# Patient Record
Sex: Male | Born: 1970 | Race: White | Hispanic: Yes | Marital: Single | Smoking: Never smoker
Health system: Southern US, Community
[De-identification: ages and names within clinical notes are randomized; demographics above are authoritative.]

## PROBLEM LIST (undated history)

## (undated) DIAGNOSIS — G4730 Sleep apnea, unspecified: Secondary | ICD-10-CM

## (undated) DIAGNOSIS — T7840XA Allergy, unspecified, initial encounter: Secondary | ICD-10-CM

## (undated) DIAGNOSIS — I10 Essential (primary) hypertension: Secondary | ICD-10-CM

## (undated) DIAGNOSIS — B1910 Unspecified viral hepatitis B without hepatic coma: Secondary | ICD-10-CM

## (undated) DIAGNOSIS — F419 Anxiety disorder, unspecified: Secondary | ICD-10-CM

## (undated) DIAGNOSIS — K760 Fatty (change of) liver, not elsewhere classified: Secondary | ICD-10-CM

## (undated) HISTORY — DX: Fatty (change of) liver, not elsewhere classified: K76.0

## (undated) HISTORY — DX: Essential (primary) hypertension: I10

## (undated) HISTORY — DX: Anxiety disorder, unspecified: F41.9

## (undated) HISTORY — DX: Unspecified viral hepatitis B without hepatic coma: B19.10

## (undated) HISTORY — PX: OTHER SURGICAL HISTORY: SHX169

## (undated) HISTORY — PX: COLONOSCOPY: SHX174

## (undated) HISTORY — DX: Allergy, unspecified, initial encounter: T78.40XA

## (undated) HISTORY — DX: Sleep apnea, unspecified: G47.30

---

## 2020-01-28 ENCOUNTER — Encounter: Payer: Self-pay | Admitting: Gastroenterology

## 2020-01-28 ENCOUNTER — Ambulatory Visit (INDEPENDENT_AMBULATORY_CARE_PROVIDER_SITE_OTHER): Payer: 59 | Admitting: Gastroenterology

## 2020-01-28 ENCOUNTER — Other Ambulatory Visit (INDEPENDENT_AMBULATORY_CARE_PROVIDER_SITE_OTHER): Payer: 59

## 2020-01-28 VITALS — BP 140/100 | HR 80 | Ht 70.0 in | Wt 203.4 lb

## 2020-01-28 DIAGNOSIS — Z800 Family history of malignant neoplasm of digestive organs: Secondary | ICD-10-CM

## 2020-01-28 DIAGNOSIS — B1910 Unspecified viral hepatitis B without hepatic coma: Secondary | ICD-10-CM | POA: Diagnosis not present

## 2020-01-28 DIAGNOSIS — R1013 Epigastric pain: Secondary | ICD-10-CM | POA: Diagnosis not present

## 2020-01-28 DIAGNOSIS — K760 Fatty (change of) liver, not elsewhere classified: Secondary | ICD-10-CM

## 2020-01-28 LAB — CBC WITH DIFFERENTIAL/PLATELET
Basophils Absolute: 0.1 10*3/uL (ref 0.0–0.1)
Basophils Relative: 1 % (ref 0.0–3.0)
Eosinophils Absolute: 0.2 10*3/uL (ref 0.0–0.7)
Eosinophils Relative: 2.7 % (ref 0.0–5.0)
HCT: 45.9 % (ref 39.0–52.0)
Hemoglobin: 15.9 g/dL (ref 13.0–17.0)
Lymphocytes Relative: 28.2 % (ref 12.0–46.0)
Lymphs Abs: 2 10*3/uL (ref 0.7–4.0)
MCHC: 34.7 g/dL (ref 30.0–36.0)
MCV: 90.2 fl (ref 78.0–100.0)
Monocytes Absolute: 0.7 10*3/uL (ref 0.1–1.0)
Monocytes Relative: 9.3 % (ref 3.0–12.0)
Neutro Abs: 4.2 10*3/uL (ref 1.4–7.7)
Neutrophils Relative %: 58.8 % (ref 43.0–77.0)
Platelets: 267 10*3/uL (ref 150.0–400.0)
RBC: 5.08 Mil/uL (ref 4.22–5.81)
RDW: 12.6 % (ref 11.5–15.5)
WBC: 7.1 10*3/uL (ref 4.0–10.5)

## 2020-01-28 MED ORDER — SUPREP BOWEL PREP KIT 17.5-3.13-1.6 GM/177ML PO SOLN
1.0000 | ORAL | 0 refills | Status: DC
Start: 2020-01-28 — End: 2020-03-26

## 2020-01-28 NOTE — Patient Instructions (Signed)
You have been scheduled for an abdominal ultrasound at [facility] - enter through ER and let them know your there for an outpatient ultrasound) on 01/31/20 at 7:00am. Please arrive 15 minutes prior to your appointment for registration. Make certain not to have anything to eat or drink 6 hours prior to your appointment. Should you need to reschedule your appointment, please contact radiology at [phone]. This test typically takes about 30 minutes to perform.  Your provider has requested that you go to the basement level for lab work before leaving today. Press "B" on the elevator. The lab is located at the first door on the left as you exit the elevator.  You have been scheduled for an endoscopy and colonoscopy. Please follow the written instructions given to you at your visit today. Please pick up your prep supplies at the pharmacy within the next 1-3 days. If you use inhalers (even only as needed), please bring them with you on the day of your procedure.  A referral has been made to ID re: Hepatitis B - Their office will contact you with an appointment.   Start Omeprazole 40mg - once daily every morning.   We have sent the following medications to your pharmacy for you to pick up at your convenience: Suprep   Due to recent changes in healthcare laws, you may see the results of your imaging and laboratory studies on MyChart before your provider has had a chance to review them.  We understand that in some cases there may be results that are confusing or concerning to you. Not all laboratory results come back in the same time frame and the provider may be waiting for multiple results in order to interpret others.  Please give [place] 48 hours in order for your provider to thoroughly review all the results before contacting the office for clarification of your results.   Thank you for choosing me and [facility].  Dr. Thornton Park

## 2020-01-28 NOTE — Progress Notes (Signed)
Referring Provider: No ref. provider found Primary Care Physician:  Ezequiel Kayser, MD  Reason for Consultation:  Chronic hepatitis B   IMPRESSION:  Family history of colon cancer (father at age 48) Chronic hepatitis B Epigastric pain/LUQ pain with bloating Fatty liver per patient report  Family history of colon cancer without prior colon cancer screening: Colonoscopy recommended.   Chronic hepatitis B without history of advanced liver disease: Labs and ultrasound today to restage his liver disease. I've recommended that he establish care with ID.  Epigastric pain/LUQ with associated bloating: Must consider both GI and hepatic causes of symptoms. Will plan labs, ultrasound, and endoscopy for further evaluation. Empiric trial of PPI in the meantime.     PLAN: CMP, CBC, PT/INR HBV DNA VL, HBsAg, HBcAb total, HBeAg, HBeAb Abdominal ultrasound Referral to ID re: hepatitis B EGD and colonoscopy Start omeperazole 40 mg QAM Obtain records from [facility] Abstinence from alcohol recommended Review vaccine status at time of next visit  Please see the "Patient Instructions" section for addition details about the plan.  HPI: Francis Bishop is a 49 y.o. male self-referred.  Has an appointment to establish care with a PCP later this summer.  Has OSA but is refusing CPAP, anxiety, obesity, chronic hepatitis B and hypertension.  Had Covid in March 2021 and lost 20 pounds. Some prolonged fatigue.  From [place]. Lived in [place] as a child. 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 Currently working to restore card.  Had his first Covid vaccine last week.   Diagnosed with hepatitis B at time of blood donation in 1998 with a low viral load. He has not had anyone treating his hepatitis B over the last 10 years. Followed by a GI MD in [place], more recently at [facility] in 2009/2010. At that time he was told that he doesn't have the antibody. He has not previously required  treatment. No history of advanced liver disease.   Lately he has been feeling "odd." Wondering if it's related to fatty liver or hepatitis B.  Discomfort on the left side of his abdomen. Epigsatric discomfort started 2 months ago. Associated increasing abdominal girth and bloating.Notes acne on the abdomen, which is new to him. Change in stool caliber. Wonders if this is due to drinking water with the weather.  Weight fluctuates.   Notes increase in stress.   Prescribed omeprazole for LPR  09/2019 he had labs in [place] that showed platelets 247, AST 59, ALT 109, GGT 136, INR 0.98, hct 41.3, hgb 13.8. I am unable to interpret the remainder of the lab results. No recent abdominal imaging.   Father with colon cancer at age 39. No other known family history of colon cancer or polyps. No family history of uterine/endometrial cancer, pancreatic cancer or gastric/stomach cancer.   Past Medical History:  Diagnosis Date  . Anxiety   . Fatty liver   . Hepatitis B   . HTN (hypertension)   . Sleep apnea    pt refuses CPAP    Past Surgical History:  Procedure Laterality Date  . Finger Sugery Left    Thumb    Current Outpatient Medications  Medication Sig Dispense Refill  . Acetylcysteine 600 MG CAPS Take 1 capsule by mouth daily.    . Arginine 500 MG CAPS Take 2 capsules by mouth daily. With Ornithine 250 mg    . B Complex Vitamins (VITAMIN B COMPLEX PO) Take 1 capsule by mouth daily.    Marland Kitchen  cetirizine (ZYRTEC) 10 MG tablet Take 10 mg by mouth as needed for allergies.    . Cholecalciferol (VITAMIN D3) 1.25 MG (50000 UT) TABS Take 1 tablet by mouth daily.    Marland Kitchen GRAPE SEED EXTRACT PO Take 400 mg by mouth daily.    . Milk Thistle 150 MG CAPS Take 1 capsule by mouth daily.    . Misc Natural Products (GLUCOS-CHONDROIT-MSM COMPLEX PO) Take 2 capsules by mouth daily.    . Multiple Vitamin (ONE-A-DAY MENS PO) Take 1 tablet by mouth daily.    . Omega 3-6-9 Fatty Acids (OMEGA 3-6-9 COMPLEX PO) Take 2  capsules by mouth daily.    . Taurine 1000 MG CAPS Take 2 capsules by mouth daily.     No current facility-administered medications for this visit.    Allergies as of 01/28/2020  . (No Known Allergies)    Family History  Problem Relation Age of Onset  . Leukemia Mother   . Hypothyroidism Mother   . Colon cancer Father   . Colon polyps Father   . Diabetes Father   . Hypothyroidism Sister   . Diabetes Paternal Grandmother     Social History   Socioeconomic History  . Marital status: Single    Spouse name: Not on file  . Number of children: 0  . Years of education: Not on file  . Highest education level: Not on file  Occupational History  . Occupation: Interpreter  Tobacco Use  . Smoking status: Never Smoker  . Smokeless tobacco: Never Used  Vaping Use  . Vaping Use: Never used  Substance and Sexual Activity  . Alcohol use: Yes    Comment: 1-2 per week  . Drug use: Never  . Sexual activity: Not on file  Other Topics Concern  . Not on file  Social History Narrative  . Not on file   Social Determinants of Health   Financial Resource Strain:   . Difficulty of Paying Living Expenses:   Food Insecurity:   . Worried About Charity fundraiser in the Last Year:   . Arboriculturist in the Last Year:   Transportation Needs:   . Film/video editor (Medical):   Marland Kitchen Lack of Transportation (Non-Medical):   Physical Activity:   . Days of Exercise per Week:   . Minutes of Exercise per Session:   Stress:   . Feeling of Stress :   Social Connections:   . Frequency of Communication with Friends and Family:   . Frequency of Social Gatherings with Friends and Family:   . Attends Religious Services:   . Active Member of Clubs or Organizations:   . Attends Archivist Meetings:   Marland Kitchen Marital Status:   Intimate Partner Violence:   . Fear of Current or Ex-Partner:   . Emotionally Abused:   Marland Kitchen Physically Abused:   . Sexually Abused:     Review of Systems: 12  system ROS is negative except as noted above with the additions of allergies, anxiety, back pain, fatigue, headaches, muscle cramps, shortness of breath, insomnia, sore throat, excessive thirst, and excessive urination. Marland Kitchen   Physical Exam: General:   Alert,  well-nourished, pleasant and cooperative in NAD. No bilateral temporal wasting.  Head:  Normocephalic and atraumatic. Eyes:  Sclera clear, no icterus.   Conjunctiva pink. Ears:  Normal auditory acuity. Nose:  No deformity, discharge,  or lesions. Mouth:  No deformity or lesions.   Neck:  Supple; no masses or thyromegaly. Lungs:  Clear throughout to auscultation.   No wheezes. Heart:  Regular rate and rhythm; no murmurs. Abdomen:  Soft, nontender, nondistended, normal bowel sounds, no rebound or guarding. No hepatosplenomegaly. No fluid wave.   Rectal:  Deferred  Msk:  Symmetrical. No boney deformities LAD: No inguinal or umbilical LAD Extremities:  No clubbing or edema. Neurologic:  Alert and  oriented x4;  grossly nonfocal Skin:  Intact without significant lesions or rashes. No palmar erythema or spider angioma.  Psych:  Alert and cooperative. Normal mood and affect.     Terry Bolotin L. Tarri Glenn, MD, MPH 01/28/2020, 3:58 PM

## 2020-01-29 ENCOUNTER — Other Ambulatory Visit: Payer: Self-pay

## 2020-01-29 DIAGNOSIS — Z800 Family history of malignant neoplasm of digestive organs: Secondary | ICD-10-CM

## 2020-01-29 DIAGNOSIS — K760 Fatty (change of) liver, not elsewhere classified: Secondary | ICD-10-CM

## 2020-01-29 DIAGNOSIS — B1910 Unspecified viral hepatitis B without hepatic coma: Secondary | ICD-10-CM

## 2020-01-29 DIAGNOSIS — R1013 Epigastric pain: Secondary | ICD-10-CM

## 2020-01-29 LAB — COMPREHENSIVE METABOLIC PANEL
ALT: 69 U/L — ABNORMAL HIGH (ref 0–53)
AST: 32 U/L (ref 0–37)
Albumin: 5.1 g/dL (ref 3.5–5.2)
Alkaline Phosphatase: 37 U/L — ABNORMAL LOW (ref 39–117)
BUN: 20 mg/dL (ref 6–23)
CO2: 25 mEq/L (ref 19–32)
Calcium: 9.9 mg/dL (ref 8.4–10.5)
Chloride: 101 mEq/L (ref 96–112)
Creatinine, Ser: 1.01 mg/dL (ref 0.40–1.50)
GFR: 78.73 mL/min (ref >60.00)
Glucose, Bld: 92 mg/dL (ref 70–99)
Potassium: 3.9 mEq/L (ref 3.5–5.1)
Sodium: 137 mEq/L (ref 135–145)
Total Bilirubin: 0.8 mg/dL (ref 0.2–1.2)
Total Protein: 8.1 g/dL (ref 6.0–8.3)

## 2020-01-29 LAB — HEPATITIS B SURFACE ANTIGEN: Hepatitis B Surface Ag: REACTIVE — AB

## 2020-01-29 LAB — PROTIME-INR
INR: 1 ratio (ref 0.8–1.0)
Prothrombin Time: 11.3 s (ref 9.6–13.1)

## 2020-01-29 LAB — HEPATITIS B E ANTIBODY: Hep B E Ab: REACTIVE — AB

## 2020-01-29 LAB — HEPATITIS B CORE ANTIBODY, TOTAL: Hep B Core Total Ab: REACTIVE — AB

## 2020-01-29 LAB — HEPATITIS B E ANTIGEN: Hep B E Ag: NONREACTIVE

## 2020-01-31 ENCOUNTER — Ambulatory Visit (HOSPITAL_COMMUNITY)
Admission: RE | Admit: 2020-01-31 | Discharge: 2020-01-31 | Disposition: A | Discharge status: Home / Self Care | Payer: 59 | Source: Ambulatory Visit | Attending: Gastroenterology | Admitting: Gastroenterology

## 2020-01-31 ENCOUNTER — Other Ambulatory Visit: Payer: Self-pay

## 2020-01-31 DIAGNOSIS — B1910 Unspecified viral hepatitis B without hepatic coma: Secondary | ICD-10-CM | POA: Insufficient documentation

## 2020-01-31 DIAGNOSIS — K760 Fatty (change of) liver, not elsewhere classified: Secondary | ICD-10-CM

## 2020-01-31 DIAGNOSIS — R1013 Epigastric pain: Secondary | ICD-10-CM

## 2020-01-31 DIAGNOSIS — Z800 Family history of malignant neoplasm of digestive organs: Secondary | ICD-10-CM | POA: Diagnosis present

## 2020-02-01 LAB — HEPATITIS B DNA, ULTRAQUANTITATIVE, PCR
Hepatitis B DNA (Calc): 1.33 Log IU/mL — ABNORMAL HIGH
Hepatitis B DNA: 21 IU/mL — ABNORMAL HIGH

## 2020-02-26 ENCOUNTER — Ambulatory Visit (INDEPENDENT_AMBULATORY_CARE_PROVIDER_SITE_OTHER): Payer: 59 | Admitting: Internal Medicine

## 2020-02-26 ENCOUNTER — Encounter: Payer: Self-pay | Admitting: Internal Medicine

## 2020-02-26 ENCOUNTER — Other Ambulatory Visit: Payer: Self-pay

## 2020-02-26 DIAGNOSIS — B181 Chronic viral hepatitis B without delta-agent: Secondary | ICD-10-CM | POA: Diagnosis not present

## 2020-02-26 NOTE — Progress Notes (Signed)
[facility]      Reason for Consult: chronic hepatitis B    Referring Physician: Dr. Tarri Glenn    Patient ID: Francis Bishop, male    DOB: March 09, 1971, 49 y.o.   MRN: [id number]  HPI:   He was initially diagnosed with hepatitis B after a blood donation in 1998 and was found to have a low viral load at that time.  He has not been followed for this over the last 10 years.  He previously was at [facility].  He has not history of treatment for hepatitis B.  He was recently evaluated by GI for fatty liver and abdominal pain.   Evaluation in regards to hepatitis B show a positive hepatitis B surface Ag, a viral load of 21 IU, AST 32, ALT 69.  Ultrasound with fatty liver, no cirrhosis.  E Ab positive, E Ag negative.  He has never been on treatment.    Past Medical History:  Diagnosis Date  . Anxiety   . Fatty liver   . Hepatitis B   . HTN (hypertension)   . Sleep apnea    pt refuses CPAP    Prior to Admission medications   Medication Sig Start Date End Date Taking? Authorizing Provider  Acetylcysteine 600 MG CAPS Take 1 capsule by mouth daily.    [provider]  Arginine 500 MG CAPS Take 2 capsules by mouth daily. With Ornithine 250 mg    [provider]  B Complex Vitamins (VITAMIN B COMPLEX PO) Take 1 capsule by mouth daily.    [provider]  cetirizine (ZYRTEC) 10 MG tablet Take 10 mg by mouth as needed for allergies.    [provider]  Cholecalciferol (VITAMIN D3) 1.25 MG (50000 UT) TABS Take 1 tablet by mouth daily.    [provider]  GRAPE SEED EXTRACT PO Take 400 mg by mouth daily.    [provider]  Milk Thistle 150 MG CAPS Take 1 capsule by mouth daily.    [provider]  Misc Natural Products (GLUCOS-CHONDROIT-MSM COMPLEX PO) Take 2 capsules by mouth daily.    [provider]  Multiple Vitamin (ONE-A-DAY MENS PO) Take 1 tablet by mouth daily.    [provider]  Na  Sulfate-K Sulfate-Mg Sulf (SUPREP BOWEL PREP KIT) 17.5-3.13-1.6 GM/177ML SOLN Take 1 kit by mouth as directed. For colonoscopy prep 01/28/20   Thornton Park, MD  Omega 3-6-9 Fatty Acids (OMEGA 3-6-9 COMPLEX PO) Take 2 capsules by mouth daily.    [provider]  Taurine 1000 MG CAPS Take 2 capsules by mouth daily.    [provider]    No Known Allergies  Social History   Tobacco Use  . Smoking status: Never Smoker  . Smokeless tobacco: Never Used  Vaping Use  . Vaping Use: Never used  Substance Use Topics  . Alcohol use: Yes    Comment: 1-2 per week  . Drug use: Never    Family History  Problem Relation Age of Onset  . Leukemia Mother   . Hypothyroidism Mother   . Colon cancer Father   . Colon polyps Father   . Diabetes Father   . Hypothyroidism Sister   . Diabetes Paternal Grandmother     Review of Systems  Constitutional: negative for fatigue Ears, nose, mouth, throat, and face: positive for nasal congestion Gastrointestinal: negative for nausea and vomiting Integument/breast: negative for rash All other systems reviewed and are negative  Constitutional: in no apparent distress There were no vitals filed for this visit. EYES: anicteric Cardiovascular: Cor RRR Respiratory: clear Musculoskeletal: no pedal edema noted Skin: negatives: no rash Neuro: non-focal  Labs: Lab Results  Component Value Date   WBC 7.1 01/28/2020   HGB 15.9 01/28/2020   HCT 45.9 01/28/2020   MCV 90.2 01/28/2020   PLT 267.0 01/28/2020    Lab Results  Component Value Date   CREATININE 1.01 01/28/2020   BUN 20 01/28/2020   NA 137 01/28/2020   K 3.9 01/28/2020   CL 101 01/28/2020   CO2 25 01/28/2020    Lab Results  Component Value Date   ALT 69 (H) 01/28/2020   AST 32 01/28/2020   ALKPHOS 37 (L) 01/28/2020   BILITOT 0.8 01/28/2020   INR 1.0 01/28/2020     Assessment: chronic inactive hepatitis B.  I discussed the results with him including the very low  level viral load and indications for treatment.  He has steatosis which complicates a bit the considerations since his AST and ALt are a bit elevated but likely a result of the steatosis.  At this time, no indication for treatment and will continue to monitor.  Additionally, I will do an ultrasound every 6 months for now pending repeat analysis of his labs, though with his low viral load, if it remains so, will not likely need to monitor for [facility] lifelong.  With his next labs will check for hepatitis A immunity and heaptitis D for completeness.      Plan: 1) labs and ultrasound in 6 months 2) follow up with me after those results.

## 2020-03-09 ENCOUNTER — Other Ambulatory Visit: Payer: Self-pay | Admitting: Physical Medicine & Rehabilitation

## 2020-03-09 DIAGNOSIS — M5416 Radiculopathy, lumbar region: Secondary | ICD-10-CM

## 2020-03-18 ENCOUNTER — Encounter: Payer: Self-pay | Admitting: Gastroenterology

## 2020-03-26 ENCOUNTER — Encounter: Payer: Self-pay | Admitting: Gastroenterology

## 2020-03-26 ENCOUNTER — Other Ambulatory Visit: Payer: Self-pay

## 2020-03-26 ENCOUNTER — Ambulatory Visit (AMBULATORY_SURGERY_CENTER): Payer: 59 | Admitting: Gastroenterology

## 2020-03-26 VITALS — BP 115/70 | HR 68 | Temp 97.8°F | Resp 12 | Ht 70.0 in | Wt 203.0 lb

## 2020-03-26 DIAGNOSIS — K760 Fatty (change of) liver, not elsewhere classified: Secondary | ICD-10-CM

## 2020-03-26 DIAGNOSIS — K219 Gastro-esophageal reflux disease without esophagitis: Secondary | ICD-10-CM | POA: Diagnosis not present

## 2020-03-26 DIAGNOSIS — Z800 Family history of malignant neoplasm of digestive organs: Secondary | ICD-10-CM

## 2020-03-26 DIAGNOSIS — K2970 Gastritis, unspecified, without bleeding: Secondary | ICD-10-CM

## 2020-03-26 DIAGNOSIS — B9681 Helicobacter pylori [H. pylori] as the cause of diseases classified elsewhere: Secondary | ICD-10-CM

## 2020-03-26 DIAGNOSIS — Z1211 Encounter for screening for malignant neoplasm of colon: Secondary | ICD-10-CM

## 2020-03-26 DIAGNOSIS — D124 Benign neoplasm of descending colon: Secondary | ICD-10-CM | POA: Diagnosis not present

## 2020-03-26 DIAGNOSIS — B1910 Unspecified viral hepatitis B without hepatic coma: Secondary | ICD-10-CM

## 2020-03-26 DIAGNOSIS — D120 Benign neoplasm of cecum: Secondary | ICD-10-CM

## 2020-03-26 DIAGNOSIS — R1013 Epigastric pain: Secondary | ICD-10-CM

## 2020-03-26 DIAGNOSIS — K2950 Unspecified chronic gastritis without bleeding: Secondary | ICD-10-CM | POA: Diagnosis not present

## 2020-03-26 MED ORDER — OMEPRAZOLE 40 MG PO CPDR: 40.0000 mg | DELAYED_RELEASE_CAPSULE | Freq: Every day | ORAL | 3 refills | Status: DC

## 2020-03-26 MED ORDER — SODIUM CHLORIDE 0.9 % IV SOLN: 500.0000 mL | Freq: Once | INTRAVENOUS | Status: AC

## 2020-03-26 NOTE — Op Note (Signed)
[place] Patient Name: Francis Bishop Procedure Date: 03/26/2020 2:16 PM MRN: [id number] Endoscopist: Thornton Park MD, MD Age: 49 Referring MD:  Date of Birth: [birth year] Gender: Male Account #: [id number] Procedure:                Colonoscopy Indications:              Screening patient at increased risk: Family history                            of 1st-degree relative with colorectal cancer at                            age 78 years (or older), This is the patient's                            first colonoscopy                           Family history of colon cancer (father at age 11) Medicines:                Monitored Anesthesia Care Procedure:                Pre-Anesthesia Assessment:                           - Prior to the procedure, a History and Physical                            was performed, and patient medications and                            allergies were reviewed. The patient's tolerance of                            previous anesthesia was also reviewed. The risks                            and benefits of the procedure and the sedation                            options and risks were discussed with the patient.                            All questions were answered, and informed consent                            was obtained. Prior Anticoagulants: The patient has                            taken no previous anticoagulant or antiplatelet                            agents. ASA Grade Assessment: II - A patient with  mild systemic disease. After reviewing the risks                            and benefits, the patient was deemed in                            satisfactory condition to undergo the procedure.                           After obtaining informed consent, the colonoscope                            was passed under direct vision. Throughout the                            procedure, the patient's blood pressure, pulse,  and                            oxygen saturations were monitored continuously. The                            Colonoscope was introduced through the anus and                            advanced to the 3 cm into the ileum. The                            colonoscopy was performed without difficulty. The                            patient tolerated the procedure well. The quality                            of the bowel preparation was good. The terminal                            ileum, ileocecal valve, appendiceal orifice, and                            rectum were photographed. Scope In: 2:34:54 PM Scope Out: 2:51:00 PM Scope Withdrawal Time: 0 hours 13 minutes 49 seconds  Total Procedure Duration: 0 hours 16 minutes 6 seconds  Findings:                 The perianal and digital rectal examinations were                            normal.                           Multiple small and large-mouthed diverticula were                            found in the sigmoid colon and distal descending  colon.                           A 2 mm polyp was found in the descending colon. The                            polyp was sessile. The polyp was removed with a                            cold snare. Resection and retrieval were complete.                            Estimated blood loss was minimal.                           A 1 mm polyp was found in the cecum. The polyp was                            sessile. The polyp was removed with a cold snare.                            Resection and retrieval were complete. Estimated                            blood loss was minimal.                           The exam was otherwise without abnormality on                            direct and retroflexion views. Complications:            No immediate complications. Estimated blood loss:                            Minimal. Estimated Blood Loss:     Estimated blood loss was  minimal. Impression:               - Diverticulosis in the sigmoid colon and in the                            distal descending colon.                           - One 2 mm polyp in the descending colon, removed                            with a cold snare. Resected and retrieved.                           - One 1 mm polyp in the cecum, removed with a cold                            snare. Resected and retrieved.                           -  The examination was otherwise normal on direct                            and retroflexion views. Recommendation:           - Patient has a contact number available for                            emergencies. The signs and symptoms of potential                            delayed complications were discussed with the                            patient. Return to normal activities tomorrow.                            Written discharge instructions were provided to the                            patient.                           - Follow a high fiber diet. Drink at least 64                            ounces of water daily. Add a daily stool bulking                            agent such as psyllium (an exampled would be                            Metamucil).                           - Continue present medications.                           - Await pathology results.                           - Repeat colonoscopy date to be determined after                            pending pathology results are reviewed for                            surveillance.                           - Emerging evidence supports eating a diet of                            fruits, vegetables, grains, calcium, and yogurt  while reducing red meat and alcohol may reduce the                            risk of colon cancer.                           - Thank you for allowing me to be involved in your                            colon cancer prevention. Thornton Park MD, MD 03/26/2020 3:00:59 PM This report has been signed electronically.

## 2020-03-26 NOTE — Progress Notes (Signed)
PT taken to PACU. Monitors in place. VSS. Report given to RN. 

## 2020-03-26 NOTE — Patient Instructions (Addendum)
YOU HAD AN ENDOSCOPIC PROCEDURE TODAY AT [facility]:   Refer to the procedure report that was given to you for any specific questions about what was found during the examination.  If the procedure report does not answer your questions, please call your gastroenterologist to clarify.  If you requested that your care partner not be given the details of your procedure findings, then the procedure report has been included in a sealed envelope for you to review at your convenience later.  YOU SHOULD EXPECT: Some feelings of bloating in the abdomen. Passage of more gas than usual.  Walking can help get rid of the air that was put into your GI tract during the procedure and reduce the bloating. If you had a lower endoscopy (such as a colonoscopy or flexible sigmoidoscopy) you may notice spotting of blood in your stool or on the toilet paper. If you underwent a bowel prep for your procedure, you may not have a normal bowel movement for a few days.  Please Note:  You might notice some irritation and congestion in your nose or some drainage.  This is from the oxygen used during your procedure.  There is no need for concern and it should clear up in a day or so.  SYMPTOMS TO REPORT IMMEDIATELY:   Following lower endoscopy (colonoscopy or flexible sigmoidoscopy):  Excessive amounts of blood in the stool  Significant tenderness or worsening of abdominal pains  Swelling of the abdomen that is new, acute  Fever of 100F or higher   Following upper endoscopy (EGD)  Vomiting of blood or coffee ground material  New chest pain or pain under the shoulder blades  Painful or persistently difficult swallowing  New shortness of breath  Fever of 100F or higher  Black, tarry-looking stools  For urgent or emergent issues, a gastroenterologist can be reached at any hour by calling [phone]. Do not use MyChart messaging for urgent concerns.    DIET:  We do recommend a small meal at first, but  then you may proceed to your regular diet.  Drink plenty of fluids but you should avoid alcoholic beverages for 24 hours. Follow a High Fiber Diet. Drink at least 64 ounces of water daily. Add a daily stool bulking agent such as psyllium (an example would be Metamucil).  MEDICATIONS: Continue present medications. No Aspirin, Ibuprofen, Naproxen, or other non-steroidal anti-inflammatory drugs indefinitely. Continue Omeprazole 40 mg by mouth every morning.  Please see handouts given to you by your recovery nurse.  ACTIVITY:  You should plan to take it easy for the rest of today and you should NOT DRIVE or use heavy machinery until tomorrow (because of the sedation medicines used during the test).    FOLLOW UP: Our staff will call the number listed on your records 48-72 hours following your procedure to check on you and address any questions or concerns that you may have regarding the information given to you following your procedure. If we do not reach you, we will leave a message.  We will attempt to reach you two times.  During this call, we will ask if you have developed any symptoms of COVID 19. If you develop any symptoms (ie: fever, flu-like symptoms, shortness of breath, cough etc.) before then, please call [phone].  If you test positive for Covid 19 in the 2 weeks post procedure, please call and report this information to [place].    If any biopsies were taken you will be contacted by  phone or by letter within the next 1-3 weeks.  Please call us at [phone] if you have not heard about the biopsies in 3 weeks.   Thank you for allowing [place] to provide for your healthcare needs today.   SIGNATURES/CONFIDENTIALITY: You and/or your care partner have signed paperwork which will be entered into your electronic medical record.  These signatures attest to the fact that that the information above on your After Visit Summary has been reviewed and is understood.  Full responsibility of the  confidentiality of this discharge information lies with you and/or your care-partner.

## 2020-03-26 NOTE — Progress Notes (Signed)
Called to room to assist during endoscopic procedure.  Patient ID and intended procedure confirmed with present staff. Received instructions for my participation in the procedure from the performing physician.  

## 2020-03-26 NOTE — Progress Notes (Signed)
VS-CW 

## 2020-03-26 NOTE — Op Note (Signed)
[place] Patient Name: Basilio Meadow Procedure Date: 03/26/2020 2:17 PM MRN: [id number] Endoscopist: Thornton Park MD, MD Age: 49 Referring MD:  Date of Birth: 1971-05-14 Gender: Male Account #: [id number] Procedure:                Upper GI endoscopy Indications:              Epigastric abdominal pain, Abdominal pain in the                            left upper quadrant, Abdominal bloating Medicines:                Monitored Anesthesia Care Procedure:                Pre-Anesthesia Assessment:                           - Prior to the procedure, a History and Physical                            was performed, and patient medications and                            allergies were reviewed. The patient's tolerance of                            previous anesthesia was also reviewed. The risks                            and benefits of the procedure and the sedation                            options and risks were discussed with the patient.                            All questions were answered, and informed consent                            was obtained. Prior Anticoagulants: The patient has                            taken no previous anticoagulant or antiplatelet                            agents. ASA Grade Assessment: II - A patient with                            mild systemic disease. After reviewing the risks                            and benefits, the patient was deemed in                            satisfactory condition to undergo the procedure.  After obtaining informed consent, the endoscope was                            passed under direct vision. Throughout the                            procedure, the patient's blood pressure, pulse, and                            oxygen saturations were monitored continuously. The                            Endoscope was introduced through the mouth, and                            advanced to the  third part of duodenum. The upper                            GI endoscopy was accomplished without difficulty.                            The patient tolerated the procedure well. Scope In: Scope Out: Findings:                 LA Grade A (one or more mucosal breaks less than 5                            mm, not extending between tops of 2 mucosal folds)                            esophagitis with no bleeding was found. Biopsies                            were taken with a cold forceps for histology.                            Estimated blood loss was minimal.                           Diffuse mild inflammation characterized by                            erythema, friability and granularity was found in                            the gastric antrum. Biopsies were taken from the                            antrum, body, and fundus with a cold forceps for                            histology. Estimated blood loss was minimal.  The examined duodenum was normal. Complications:            No immediate complications. Estimated blood loss:                            Minimal. Estimated Blood Loss:     Estimated blood loss was minimal. Impression:               - LA Grade A esophagitis with no bleeding. Biopsied.                           - Gastritis. Biopsied.                           - Normal examined duodenum. Recommendation:           - Patient has a contact number available for                            emergencies. The signs and symptoms of potential                            delayed complications were discussed with the                            patient. Return to normal activities tomorrow.                            Written discharge instructions were provided to the                            patient.                           - Resume previous diet.                           - Continue present medications including omeprazole                            40 mg  QAM.                           - No aspirin, ibuprofen, naproxen, or other                            non-steroidal anti-inflammatory drugs [Time to                            Restart].                           - Await pathology results. Thornton Park MD, MD 03/26/2020 2:57:50 PM This report has been signed electronically.

## 2020-03-27 ENCOUNTER — Ambulatory Visit
Admission: RE | Admit: 2020-03-27 | Discharge: 2020-03-27 | Disposition: A | Discharge status: Home / Self Care | Payer: 59 | Source: Ambulatory Visit | Attending: Physical Medicine & Rehabilitation | Admitting: Physical Medicine & Rehabilitation

## 2020-03-27 DIAGNOSIS — M5416 Radiculopathy, lumbar region: Secondary | ICD-10-CM | POA: Diagnosis not present

## 2020-03-30 ENCOUNTER — Telehealth: Payer: Self-pay

## 2020-03-30 NOTE — Telephone Encounter (Signed)
  Follow up Call-  Call back number 03/26/2020  Post procedure Call Back phone  # [phone]  Permission to leave phone message Yes     Patient questions:  Do you have a fever, pain , or abdominal swelling? No. Pain Score  0 *  Have you tolerated food without any problems? Yes.    Have you been able to return to your normal activities? Yes.    Do you have any questions about your discharge instructions: Diet   No. Medications  No. Follow up visit  No.  Do you have questions or concerns about your Care? No.  Actions: * If pain score is 4 or above: No action needed, pain <4.  1. Have you developed a fever since your procedure? no  2.   Have you had an respiratory symptoms (SOB or cough) since your procedure? no  3.   Have you tested positive for COVID 19 since your procedure no  4.   Have you had any family members/close contacts diagnosed with the COVID 19 since your procedure?  no   If yes to any of these questions please route to Joylene John, RN and Joella Prince, RN

## 2020-04-15 ENCOUNTER — Other Ambulatory Visit: Payer: Self-pay

## 2020-04-15 MED ORDER — TETRACYCLINE HCL 500 MG PO CAPS: 500.0000 mg | ORAL_CAPSULE | Freq: Four times a day (QID) | ORAL | 0 refills | Status: DC

## 2020-04-15 MED ORDER — METRONIDAZOLE 500 MG PO TABS: 500.0000 mg | ORAL_TABLET | Freq: Four times a day (QID) | ORAL | 0 refills | Status: DC

## 2020-04-15 MED ORDER — BISMUTH 262 MG PO CHEW: 524.0000 mg | CHEWABLE_TABLET | Freq: Four times a day (QID) | ORAL | 0 refills | Status: DC

## 2020-04-15 MED ORDER — OMEPRAZOLE 40 MG PO CPDR: 40.0000 mg | DELAYED_RELEASE_CAPSULE | Freq: Two times a day (BID) | ORAL | 0 refills | Status: DC

## 2020-06-05 ENCOUNTER — Telehealth: Payer: Self-pay

## 2020-06-05 ENCOUNTER — Other Ambulatory Visit: Payer: Self-pay

## 2020-06-05 DIAGNOSIS — Q400 Congenital hypertrophic pyloric stenosis: Secondary | ICD-10-CM

## 2020-06-05 DIAGNOSIS — B9681 Helicobacter pylori [H. pylori] as the cause of diseases classified elsewhere: Secondary | ICD-10-CM

## 2020-06-05 DIAGNOSIS — K2970 Gastritis, unspecified, without bleeding: Secondary | ICD-10-CM

## 2020-06-05 NOTE — Telephone Encounter (Signed)
Appt has been rescheduled as follows:  Next Appt With Gastroenterology Thornton Park, MD) 07/30/2020 at 11:10 AM  My Chart message sent to pt informing him about this change.

## 2020-06-05 NOTE — Telephone Encounter (Signed)
Followup after the results are available is recommended. Thank you.

## 2020-06-05 NOTE — Telephone Encounter (Signed)
From path results DOS 03/26/20:  Given these results I recommend 14 days of treatment with bismuth subsalicylate 208 mg 4 times daily daily, metronidazole 500 mg 4 times daily, tetracycline 500 mg 4 times daily, and pantoprazole 40 mg twice daily. I recommend that the patient abstain from all alcohol while taking metronidazole. Eight weeks after completing the treatment, an H pylori stool antigen should be performed to monitor for response to treatment.   Spoke with pt to determine when he completed his treatment. Pt is uncertain of date but feels he may have been off the treatment for about 3 weeks. That said, it seems pt would not need to repeat stool collection until December 24th. Given the holidays, pt is aware that he will need to pick up supplies from [place] on December 27th, then return after collected. Routing this message to Dr. Tarri Glenn to determine if appt should remain as scheduled for 06/19/20 or if she would prefer to have stool results at the time of appt. Will await her response.

## 2020-06-19 ENCOUNTER — Ambulatory Visit: Payer: 59 | Admitting: Gastroenterology

## 2020-06-30 ENCOUNTER — Other Ambulatory Visit (HOSPITAL_BASED_OUTPATIENT_CLINIC_OR_DEPARTMENT_OTHER): Payer: Self-pay

## 2020-06-30 DIAGNOSIS — R0683 Snoring: Secondary | ICD-10-CM

## 2020-06-30 DIAGNOSIS — R0681 Apnea, not elsewhere classified: Secondary | ICD-10-CM

## 2020-06-30 DIAGNOSIS — G4710 Hypersomnia, unspecified: Secondary | ICD-10-CM

## 2020-07-20 ENCOUNTER — Other Ambulatory Visit: Payer: Self-pay

## 2020-07-20 ENCOUNTER — Ambulatory Visit (HOSPITAL_BASED_OUTPATIENT_CLINIC_OR_DEPARTMENT_OTHER): Payer: 59 | Attending: Internal Medicine | Admitting: Internal Medicine

## 2020-07-20 VITALS — Ht 70.0 in | Wt 200.0 lb

## 2020-07-20 DIAGNOSIS — R0683 Snoring: Secondary | ICD-10-CM

## 2020-07-20 DIAGNOSIS — I10 Essential (primary) hypertension: Secondary | ICD-10-CM | POA: Insufficient documentation

## 2020-07-20 DIAGNOSIS — G4710 Hypersomnia, unspecified: Secondary | ICD-10-CM

## 2020-07-20 DIAGNOSIS — G4733 Obstructive sleep apnea (adult) (pediatric): Secondary | ICD-10-CM | POA: Diagnosis not present

## 2020-07-20 DIAGNOSIS — R0681 Apnea, not elsewhere classified: Secondary | ICD-10-CM

## 2020-07-21 ENCOUNTER — Encounter (HOSPITAL_BASED_OUTPATIENT_CLINIC_OR_DEPARTMENT_OTHER): Payer: Self-pay | Admitting: Internal Medicine

## 2020-07-21 DIAGNOSIS — R0683 Snoring: Secondary | ICD-10-CM | POA: Insufficient documentation

## 2020-07-21 HISTORY — DX: Snoring: R06.83

## 2020-07-22 ENCOUNTER — Ambulatory Visit
Admission: RE | Admit: 2020-07-22 | Discharge: 2020-07-22 | Disposition: A | Discharge status: Home / Self Care | Payer: 59 | Source: Ambulatory Visit | Attending: Internal Medicine | Admitting: Internal Medicine

## 2020-07-22 DIAGNOSIS — B181 Chronic viral hepatitis B without delta-agent: Secondary | ICD-10-CM

## 2020-07-25 LAB — H. PYLORI ANTIGEN, STOOL: H pylori Ag, Stl: NEGATIVE

## 2020-07-29 NOTE — Procedures (Signed)
   NAME: Francis Bishop DATE OF BIRTH:  [birth year] MEDICAL RECORD NUMBER [id number]  LOCATION: [facility]  PHYSICIAN: Marius Ditch  DATE OF STUDY: 07/20/2020  SLEEP STUDY TYPE: Nocturnal Polysomnogram               REFERRING PHYSICIAN: Marius Ditch, MD  EPWORTH SLEEPINESS SCORE:  12 HEIGHT: 5\' 10"  (177.8 cm)  WEIGHT: 200 lb (90.7 kg)    Body mass index is 28.7 kg/m.  NECK SIZE: 17.5 in.  CLINICAL INFORMATION The patient was referred to the sleep center for evaluation of excessive daytime sleepiness, witnessed apnea, and snoring. 6Awakens gasping for breath.   MEDICATIONS No sleep medicine administered.Marland Kitchen  SLEEP STUDY TECHNIQUE A multi-channel overnight Polysomnography study was performed. The channels recorded and monitored were central and occipital EEG, electrooculogram (EOG), submentalis EMG (chin), nasal and oral airflow, thoracic and abdominal wall motion, anterior tibialis EMG, snore microphone, electrocardiogram, and a pulse oximetry.  TECHNICAL COMMENTS Comments added by Technician: PATIENT WAS ORDERED AS A NPSG ONLY. Comments added by Scorer: N/A  SLEEP ARCHITECTURE The study was initiated at 10:34:50 PM and terminated at 5:03:54 AM. The total recorded time was 389.1 minutes. EEG confirmed total sleep time was 361.5 minutes yielding a sleep efficiency of 92.9%. Sleep onset after lights out was 4.9 minutes with a REM latency of 214.0 minutes. The patient spent 9.68% of the night in stage N1 sleep, 75.10% in stage N2 sleep, 0.00% in stage N3 and 15.2% in REM. Wake after sleep onset (WASO) was 22.7 minutes. The Arousal Index was 39.3/hour.  RESPIRATORY PARAMETERS There were a total of 281 respiratory disturbances out of which 137  were apneas ( 128 obstructive, 9 mixed, 0 central) and 144 hypopneas. The apnea/hypopnea index (AHI) was 46.6 events/hour. The central sleep apnea index was 0.0 events/hour. The REM AHI was 54.5 events/hour and NREM  AHI was 45.2 events/hour. The supine AHI was 76.9 events/hour and the non supine AHI was 37.3 events/hour. Respirator Distrubance Index was 48/hr and, in REM 57hr. Respiratory disturbances were associated with oxygen desaturation down to a nadir of 63.00% during sleep. The mean oxygen saturation during the study was 90.13%. The cumulative time under 88% oxygen saturation was 88.0 minutes.  LEG MOVEMENT DATA The total leg movements were with a resulting leg movement index of 0/hr . Associated arousal with leg movement index was 0.0/hr.  CARDIAC DATA The underlying cardiac rhythm was most consistent with sinus rhythm. Mean heart rate during sleep was 76.91 bpm. Additional rhythm abnormalities include None.  IMPRESSIONS - Severe Obstructive Sleep apnea (OSA) - No Significant Central Sleep Apnea (CSA) - No significant periodic leg movements(PLMs) during sleep. Hypoexmia noted.  DIAGNOSIS - Obstructive Sleep Apnea (G47.33)  RECOMMENDATIONS - Therapeutic CPAP titration or APAP determine optimal pressure required to alleviate sleep disordered breathing.  Marius Ditch Sleep specialist, [place] Board of Internal Medicine  ELECTRONICALLY SIGNED ON:  07/29/2020, 1:34 PM [place] PH: [phone]   FX: [phone] [place]

## 2020-07-30 ENCOUNTER — Encounter: Payer: Self-pay | Admitting: Gastroenterology

## 2020-07-30 ENCOUNTER — Ambulatory Visit (INDEPENDENT_AMBULATORY_CARE_PROVIDER_SITE_OTHER): Payer: 59 | Admitting: Gastroenterology

## 2020-07-30 VITALS — BP 126/88 | HR 80 | Ht 70.5 in | Wt 204.4 lb

## 2020-07-30 DIAGNOSIS — B1910 Unspecified viral hepatitis B without hepatic coma: Secondary | ICD-10-CM

## 2020-07-30 DIAGNOSIS — A048 Other specified bacterial intestinal infections: Secondary | ICD-10-CM | POA: Diagnosis not present

## 2020-07-30 DIAGNOSIS — R1013 Epigastric pain: Secondary | ICD-10-CM

## 2020-07-30 MED ORDER — OMEPRAZOLE 40 MG PO CPDR: 40.0000 mg | DELAYED_RELEASE_CAPSULE | Freq: Every morning | ORAL | 3 refills | Status: AC

## 2020-07-30 NOTE — Progress Notes (Addendum)
Referring Provider: Ezequiel Kayser, MD Primary Care Physician:  Ezequiel Kayser, MD  Reason for Consultation:  Chronic hepatitis B   IMPRESSION:  H pylori gastritis and LA Class A reflux esophagitis 03/26/20    - Epigastric pain/LUQ pain with bloating resolved with irradication    - H pylori stool antigen negative 07/2020 History of colon polyps    - 2 small tubular adenomas removed on colonoscopy 03/26/20    - surveillance recommended in 5 years given the family history Family history of colon cancer (father at age 96) Left-sided diverticulosis Chronic inactive hepatitis B, followed by Dr. Linus Salmons Fatty liver with elevated transaminses  H pylori gastritis presenting with epigastric/LUQ abdominal pain with associated bloating. Symptoms resolved with successful treatment  Personal history of tubular adenomas and family history of colon cancer without prior colon cancer screening: Colonoscopy recommended every 5 years.   Chronic, inactive hepatitis B without history of advanced liver disease: No evidence for advanced liver disease by labs or imaging. Followed/monitored by Dr. Linus Salmons.   Epigastric pain/LUQ with associated bloating: Must consider both GI and hepatic causes of symptoms. Will plan labs, ultrasound, and endoscopy for further evaluation. Empiric trial of PPI in the meantime.   Elevated liver enzymes: As HBV is considered inactive, will need to proceed with serologic evaluation for other causes of elevated liver enzymes if his liver enzymes remain elevated. Likely fatty liver given prior imaging. Would include Fibrosure and Fibroscan in his evaluation for staging.     PLAN: - Continue omeprazole 40 mg daily - will switch to AM from PM - Continue follow-up annually, earlier if needed - Consider labs/fibroscan to stage liver disease given fatty liver - Colonoscopy 2026 given the family history of colon cancer, earlier with new symptoms  Please see the "Patient Instructions" section  for addition details about the plan.  HPI: Francis Bishop is a 50 y.o. male who returns in schedule follow-up. I initially met him in self-referred consultation 01/28/20 for chronic hepatitis B and left-sided/epigastic abdominal discomfort.  Endoscopic evaluation of his symptoms 03/26/20 revealed H pylori gastritis and reflux. There was no intestinal metaplasia.   He completed 14 days of treatment with bismuth subsalicylate 557 mg 4 times daily daily, metronidazole 500 mg 4 times daily, tetracycline 500 mg 4 times daily, and pantoprazole 40 mg twice daily with resolution of symptoms. Follow-up H pylori stool antigen was negative 07/25/20.    Two tubular adenomas were removed during the colonoscopy. Surveillance colonoscopy in 5 years given the family history of colon cancer.   He has established care with Dr. Linus Salmons, who is following his chronic, inactive HBV. He has elevated transaminses thought to be due to concurrent fatty liver as seen on ultrasound 07/22/2020.   No new complaints or concerns at this time.    Past Medical History:  Diagnosis Date  . Allergy   . Anxiety   . Fatty liver   . Hepatitis B   . HTN (hypertension)   . Sleep apnea    pt refuses CPAP  . Snoring 07/21/2020    Past Surgical History:  Procedure Laterality Date  . COLONOSCOPY    . Finger Sugery Left    Thumb    Current Outpatient Medications  Medication Sig Dispense Refill  . Arginine 500 MG CAPS Take 2 capsules by mouth daily. With Ornithine 250 mg    . B Complex Vitamins (VITAMIN B COMPLEX PO) Take 1 capsule by mouth daily.    . Cholecalciferol 125 MCG (5000  UT) TABS Take 5,000 Units by mouth.    . fluticasone (FLONASE) 50 MCG/ACT nasal spray Place into the nose.    . Glucosamine-Chondroitin (GLUCOSAMINE CHONDR COMPLEX PO) Take 2 capsules by mouth daily.    Marland Kitchen GRAPE SEED EXTRACT PO Take 1 tablet by mouth daily.    Marland Kitchen loratadine (CLARITIN) 10 MG tablet Take 10 mg by mouth daily.    . Milk Thistle 150 MG  CAPS Take 1 capsule by mouth daily.    . Multiple Vitamin (ONE-A-DAY MENS PO) Take 1 tablet by mouth daily.    . Omega-3 1000 MG CAPS Take 2 capsules by mouth daily.    Marland Kitchen omeprazole (PRILOSEC) 40 MG capsule Take 1 capsule (40 mg total) by mouth daily. 90 capsule 3  . tadalafil (CIALIS) 5 MG tablet Take by mouth.    . Taurine 1000 MG CAPS Take 1 capsule by mouth daily.    Marland Kitchen telmisartan (MICARDIS) 80 MG tablet Take 1 tablet by mouth daily.     Current Facility-Administered Medications  Medication Dose Route Frequency Provider Last Rate Last Admin  . 0.9 %  sodium chloride infusion  500 mL Intravenous Once Thornton Park, MD        Allergies as of 07/30/2020  . (No Known Allergies)    Family History  Problem Relation Age of Onset  . Leukemia Mother   . Hypothyroidism Mother   . Colon cancer Father   . Colon polyps Father   . Diabetes Father   . Hypothyroidism Sister   . Diabetes Paternal Grandmother   . Esophageal cancer Neg Hx   . Rectal cancer Neg Hx   . Stomach cancer Neg Hx     Social History   Socioeconomic History  . Marital status: Single    Spouse name: Not on file  . Number of children: 0  . Years of education: Not on file  . Highest education level: Not on file  Occupational History  . Occupation: Interpreter  Tobacco Use  . Smoking status: Never Smoker  . Smokeless tobacco: Never Used  Vaping Use  . Vaping Use: Never used  Substance and Sexual Activity  . Alcohol use: Yes    Alcohol/week: 2.0 - 4.0 standard drinks    Types: 1 - 2 Glasses of wine, 1 - 2 Cans of beer per week    Comment: 1-2  per week  . Drug use: Never  . Sexual activity: Not on file  Other Topics Concern  . Not on file  Social History Narrative  . Not on file   Social Determinants of Health   Financial Resource Strain: Not on file  Food Insecurity: Not on file  Transportation Needs: Not on file  Physical Activity: Not on file  Stress: Not on file  Social Connections: Not on  file  Intimate Partner Violence: Not on file     Physical Exam: General:   Alert,  well-nourished, pleasant and cooperative in NAD. No bilateral temporal wasting.  Head:  Normocephalic and atraumatic. Eyes:  Sclera clear, no icterus.   Conjunctiva pink. Abdomen:  Soft, nontender, nondistended, normal bowel sounds, no rebound or guarding. No hepatosplenomegaly. No fluid wave.   Neurologic:  Alert and  oriented x4;  grossly nonfocal Skin:  Intact without significant lesions or rashes. No palmar erythema or spider angioma.  Psych:  Alert and cooperative. Normal mood and affect.     Bryli Mantey L. Tarri Glenn, MD, MPH 07/30/2020, 11:36 AM

## 2020-07-30 NOTE — Patient Instructions (Addendum)
I did not change your medications today. Please continue to take omeprazole 40 mg every day - ideally 30-60 minutes prior to breakfast.  PRESCRIPTION MEDICATION(S): We have sent the following medication(s) to your pharmacy:  . Omeprazole - Please take 40mg  by mouth in the morning  Given your family history and the tubular adenomas removed on your colonoscopy, I recommend another colonoscopy in 5 years.   I would like to see you at least annually. But, Please let me know if you have any questions or concerns prior to that time.  If you are age 49 or younger, your body mass index should be between 19-25. Your Body mass index is 28.91 kg/m. If this is out of the aformentioned range listed, please consider follow up with your Primary Care Provider.   Thank you for trusting me with your gastrointestinal care!    Thornton Park, MD, MPH

## 2020-07-31 ENCOUNTER — Other Ambulatory Visit: Payer: Self-pay | Admitting: Neurosurgery

## 2020-07-31 DIAGNOSIS — M4807 Spinal stenosis, lumbosacral region: Secondary | ICD-10-CM

## 2020-08-05 ENCOUNTER — Ambulatory Visit: Payer: 59

## 2020-08-17 ENCOUNTER — Other Ambulatory Visit: Payer: Self-pay

## 2020-08-17 ENCOUNTER — Other Ambulatory Visit: Payer: 59

## 2020-08-17 DIAGNOSIS — B181 Chronic viral hepatitis B without delta-agent: Secondary | ICD-10-CM

## 2020-08-22 LAB — HEPATITIS B DNA, ULTRAQUANTITATIVE, PCR
Hepatitis B DNA (Calc): 2.05 Log IU/mL — ABNORMAL HIGH
Hepatitis B DNA: 112 IU/mL — ABNORMAL HIGH

## 2020-08-22 LAB — HEPATITIS B SURFACE ANTIBODY,QUALITATIVE: Hep B S Ab: NONREACTIVE

## 2020-08-22 LAB — COMPLETE METABOLIC PANEL WITH GFR
AG Ratio: 2 (calc) (ref 1.0–2.5)
ALT: 34 U/L (ref 9–46)
AST: 19 U/L (ref 10–40)
Albumin: 4.9 g/dL (ref 3.6–5.1)
Alkaline phosphatase (APISO): 38 U/L (ref 36–130)
BUN: 21 mg/dL (ref 7–25)
CO2: 29 mmol/L (ref 20–32)
Calcium: 9.6 mg/dL (ref 8.6–10.3)
Chloride: 104 mmol/L (ref 98–110)
Creat: 1.04 mg/dL (ref 0.60–1.35)
GFR, Est African American: 98 mL/min/{1.73_m2} (ref >=60)
GFR, Est Non African American: 85 mL/min/{1.73_m2} (ref >=60)
Globulin: 2.4 g/dL (calc) (ref 1.9–3.7)
Glucose, Bld: 95 mg/dL (ref 65–99)
Potassium: 4.6 mmol/L (ref 3.5–5.3)
Sodium: 139 mmol/L (ref 135–146)
Total Bilirubin: 0.8 mg/dL (ref 0.2–1.2)
Total Protein: 7.3 g/dL (ref 6.1–8.1)

## 2020-08-22 LAB — HIV ANTIBODY (ROUTINE TESTING W REFLEX): HIV 1&2 Ab, 4th Generation: NONREACTIVE

## 2020-08-22 LAB — HEPATITIS A ANTIBODY, TOTAL: Hepatitis A AB,Total: REACTIVE — AB

## 2020-08-22 LAB — HEPATITIS DELTA ANTIBODY: Hepatitis D Ab, Total: NEGATIVE

## 2020-08-28 ENCOUNTER — Encounter: Payer: Self-pay | Admitting: Internal Medicine

## 2020-08-28 ENCOUNTER — Other Ambulatory Visit: Payer: Self-pay

## 2020-08-28 ENCOUNTER — Ambulatory Visit (INDEPENDENT_AMBULATORY_CARE_PROVIDER_SITE_OTHER): Payer: 59 | Admitting: Internal Medicine

## 2020-08-28 VITALS — BP 136/90 | HR 85 | Temp 98.0°F | Ht 70.0 in | Wt 210.0 lb

## 2020-08-28 DIAGNOSIS — B181 Chronic viral hepatitis B without delta-agent: Secondary | ICD-10-CM | POA: Diagnosis not present

## 2020-08-28 DIAGNOSIS — K760 Fatty (change of) liver, not elsewhere classified: Secondary | ICD-10-CM | POA: Diagnosis not present

## 2020-08-28 NOTE — Progress Notes (Signed)
   Subjective:    Patient ID: Francis Bishop, male    DOB: 09-07-1970, 50 y.o.   MRN: [id number]  HPI Here for follow up of chronic hepatitis B I saw him in August as a new patient and a known history of hepatitis B, never treated.  He is originally from from [place] and unknown history.  He was diagnosed in 1998 during a blood donation.  Also has hepatic steatosis.  Initial AST 69 but other labs wnl.  Comes in today with no new issues.     Review of Systems  Constitutional: Negative for fatigue.  Gastrointestinal: Negative for diarrhea and nausea.  Skin: Negative for rash.       Objective:   Physical Exam Constitutional:      Appearance: Normal appearance.  Eyes:     General: No scleral icterus. Neurological:     General: No focal deficit present.     Mental Status: He is alert.  Psychiatric:        Mood and Affect: Mood normal.           Assessment & Plan:

## 2020-08-28 NOTE — Assessment & Plan Note (Signed)
He is reducing his carb intake and had lost some weight but gained some back again.

## 2020-08-28 NOTE — Assessment & Plan Note (Signed)
At this time, the AST and ALT is wnl and ultrasound with no new concerns and viral load minimally detectable.  No indication at this time for treatment and will continue with every 6 month labs for monitoring.  No indication for [name] screening at this time.

## 2020-10-05 ENCOUNTER — Other Ambulatory Visit: Payer: Self-pay | Admitting: Neurology

## 2020-10-05 DIAGNOSIS — G44099 Other trigeminal autonomic cephalgias (TAC), not intractable: Secondary | ICD-10-CM

## 2020-10-06 ENCOUNTER — Ambulatory Visit
Admission: RE | Admit: 2020-10-06 | Discharge: 2020-10-06 | Disposition: A | Discharge status: Home / Self Care | Payer: 59 | Source: Ambulatory Visit | Attending: Neurology | Admitting: Neurology

## 2020-10-06 ENCOUNTER — Other Ambulatory Visit: Payer: Self-pay

## 2020-10-06 DIAGNOSIS — G44099 Other trigeminal autonomic cephalgias (TAC), not intractable: Secondary | ICD-10-CM | POA: Insufficient documentation

## 2020-10-06 MED ORDER — GADOBUTROL 1 MMOL/ML IV SOLN
9.0000 mL | Freq: Once | INTRAVENOUS | Status: AC | PRN
  Administered 2020-10-06: 9 mL via INTRAVENOUS

## 2021-02-11 ENCOUNTER — Other Ambulatory Visit: Payer: 59

## 2021-03-02 ENCOUNTER — Ambulatory Visit: Payer: Medicaid Other | Admitting: Internal Medicine

## 2022-02-22 IMAGING — US US ABDOMEN LIMITED
1 series · 14 of 25 positions shown · non-contrast
Comparison: 01/31/2020.

CLINICAL DATA: HCC screening

EXAM:
ULTRASOUND ABDOMEN LIMITED RIGHT UPPER QUADRANT

[Series 1: us abdomen limited · 0.22mm/px · 14 of 45 slices shown]
[im 1/45]
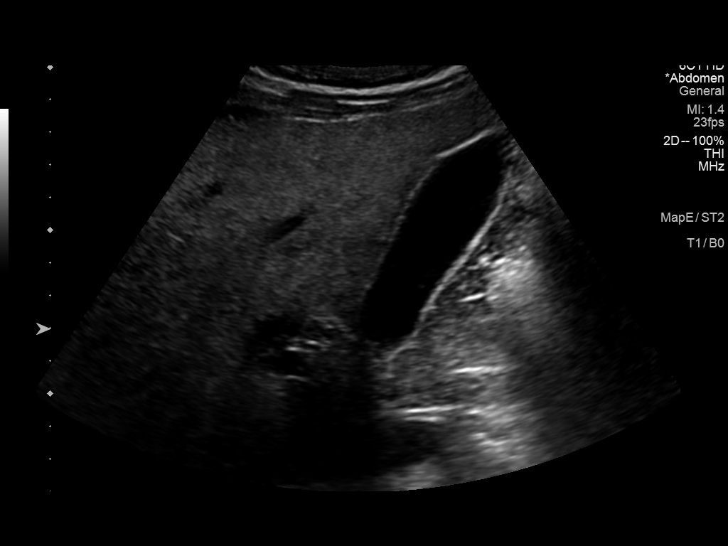
[im 4/45]
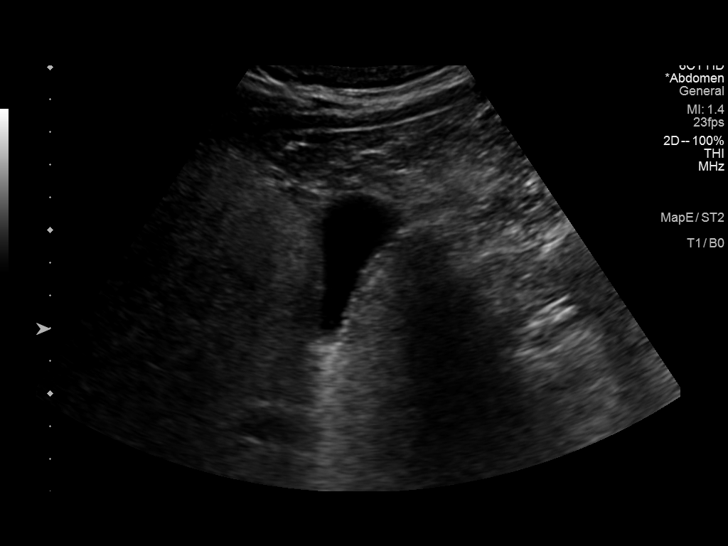
[im 8/45]
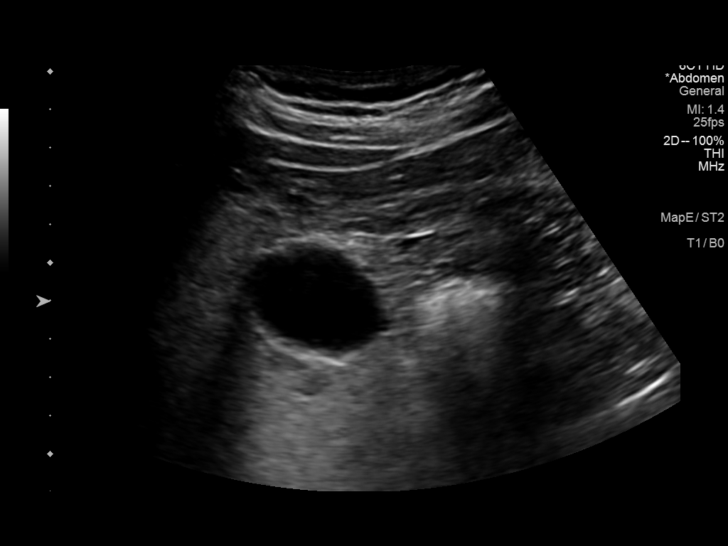
[im 12/45]
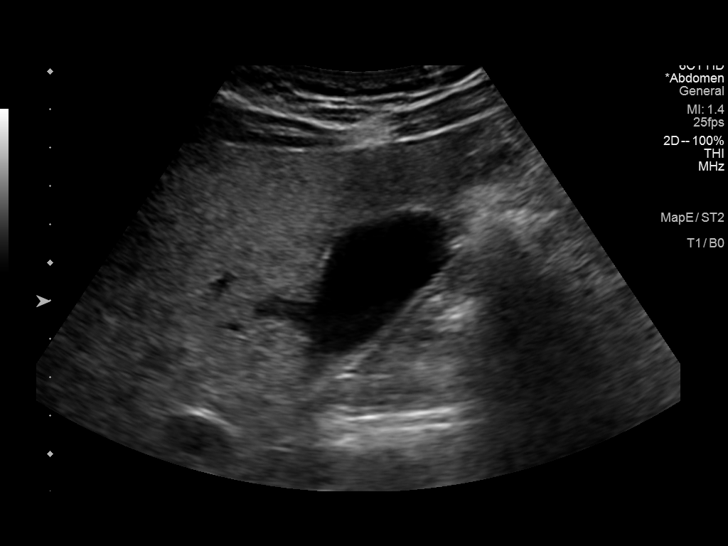
[im 15/45]
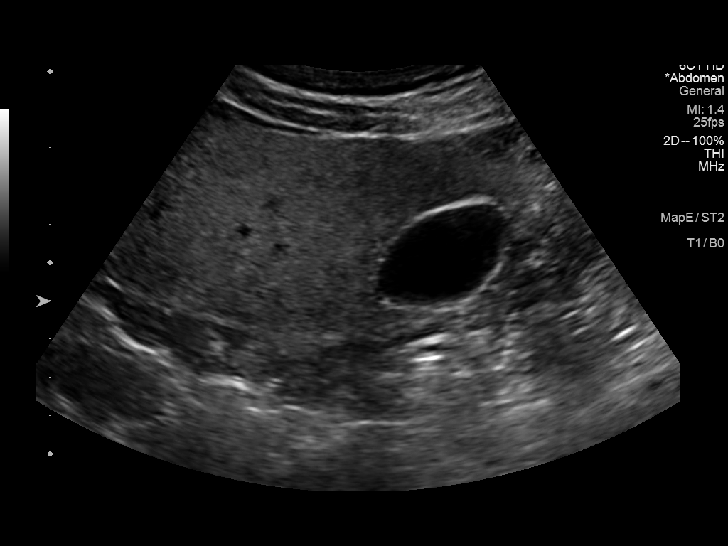
[im 17/45]
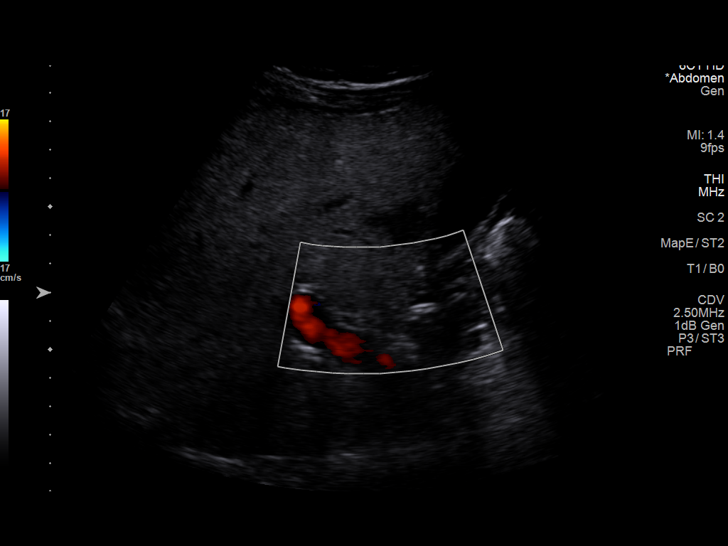
[im 21/45]
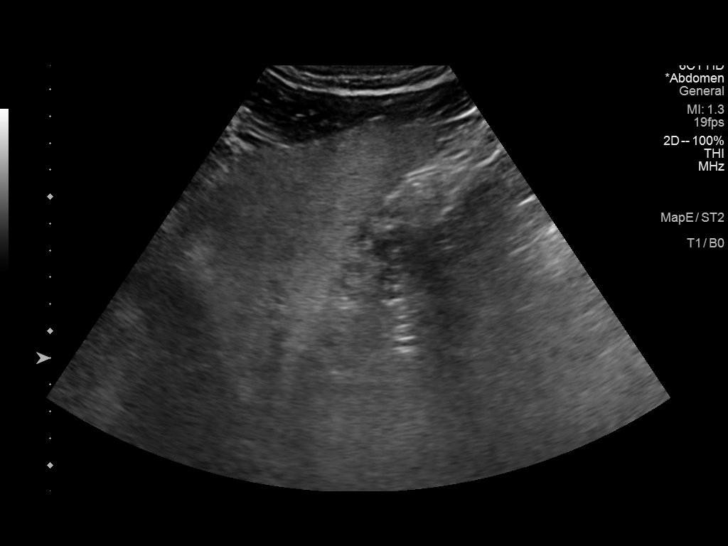
[im 24/45]
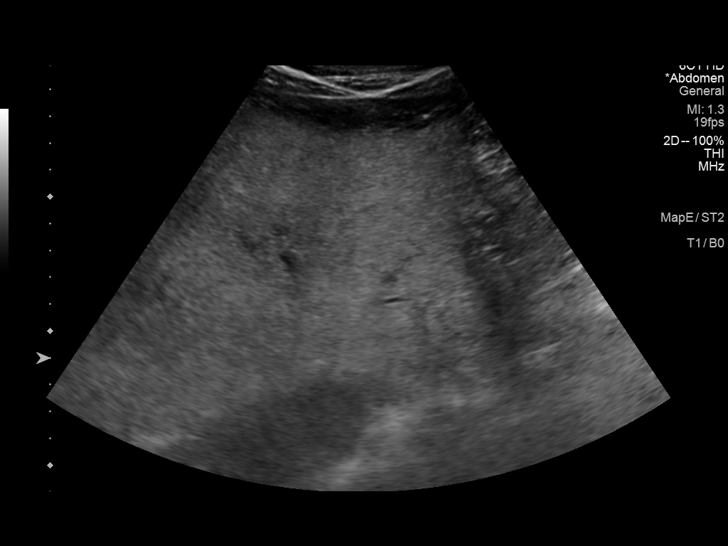
[im 28/45]
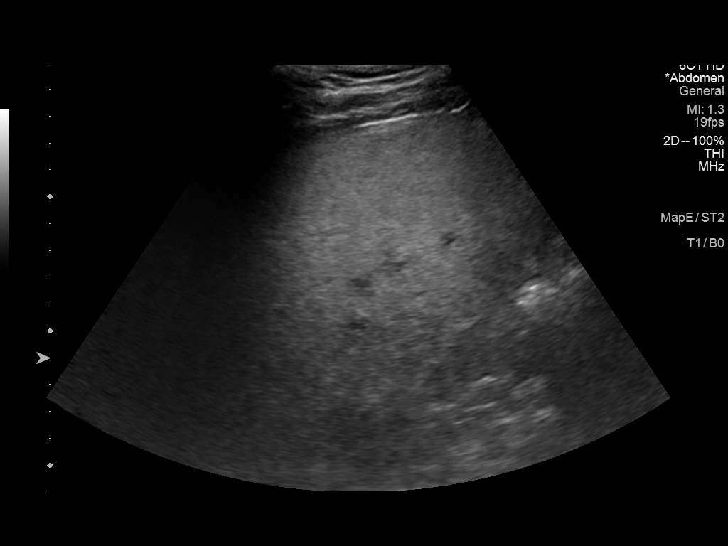
[im 30/45]
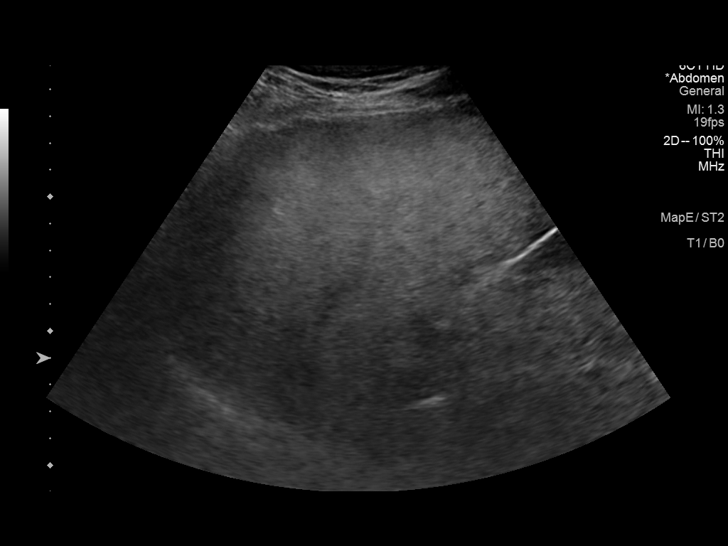
[im 34/45]
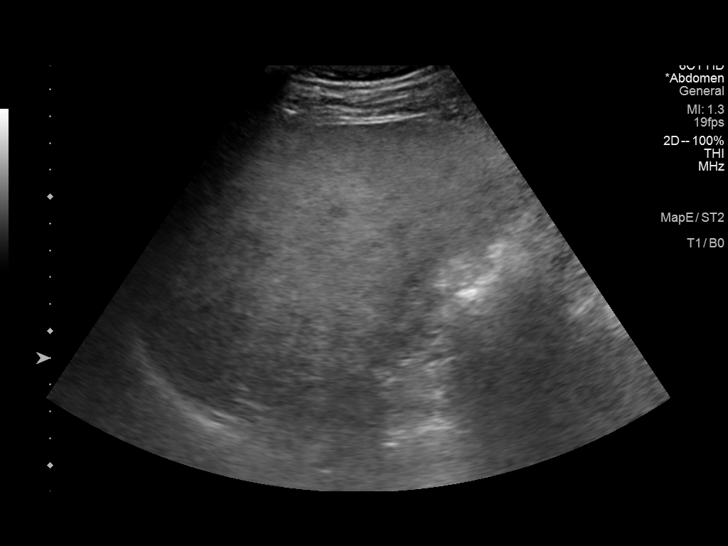
[im 37/45]
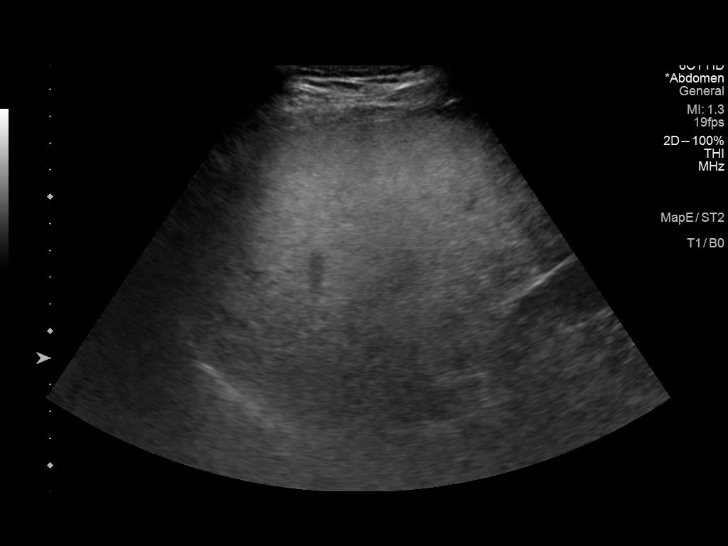
[im 41/45]
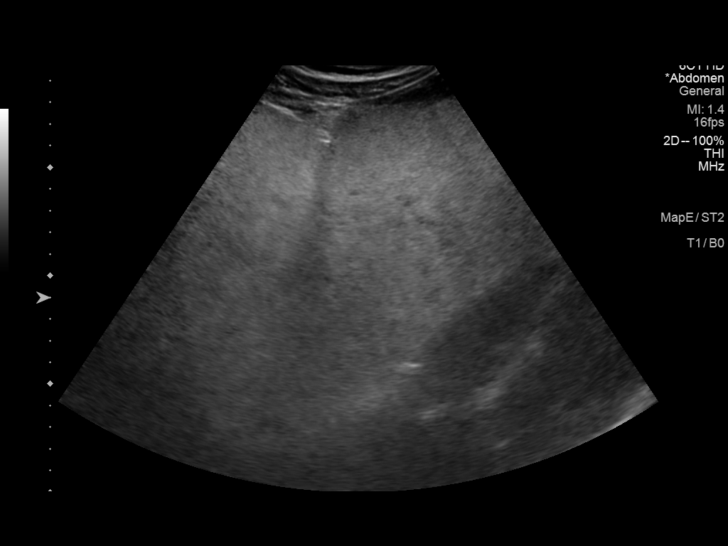
[im 45/45]
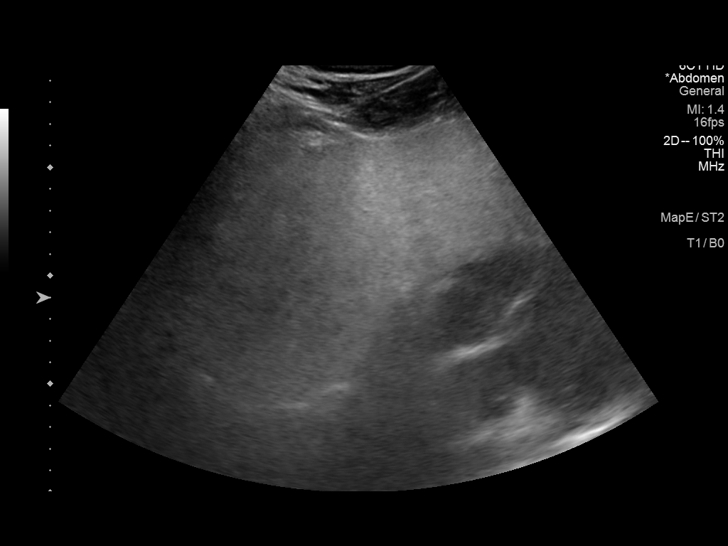

[14 of 25 positions shown; findings below may reference images not displayed]

FINDINGS: Gallbladder:

No gallstones or wall thickening visualized. No sonographic Murphy
sign noted by sonographer.

Common bile duct:

Diameter: 4.5 mm

Liver:

No focal lesion identified. Increased parenchymal echogenicity with
focal fatty sparing along the gallbladder fossa. Portal vein is
patent on color Doppler imaging with normal direction of blood flow
towards the liver.

Other: None.
IMPRESSION: Hepatic steatosis.  No focal hepatic lesion.
